# Patient Record
Sex: Male | Born: 1995 | Race: White | Hispanic: No | Smoking: Never smoker
Health system: Southern US, Community
[De-identification: ages and names within clinical notes are randomized; demographics above are authoritative.]

## PROBLEM LIST (undated history)

## (undated) DIAGNOSIS — F909 Attention-deficit hyperactivity disorder, unspecified type: Secondary | ICD-10-CM

---
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
## 2019-03-07 ENCOUNTER — Other Ambulatory Visit: Payer: Self-pay

## 2019-03-07 ENCOUNTER — Ambulatory Visit
Admission: EM | Admit: 2019-03-07 | Discharge: 2019-03-07 | Disposition: A | Discharge status: Home / Self Care | Payer: Self-pay | Attending: Physician Assistant | Admitting: Physician Assistant

## 2019-03-07 DIAGNOSIS — Z20828 Contact with and (suspected) exposure to other viral communicable diseases: Secondary | ICD-10-CM

## 2019-03-07 DIAGNOSIS — Z20822 Contact with and (suspected) exposure to covid-19: Secondary | ICD-10-CM

## 2019-03-07 NOTE — ED Triage Notes (Addendum)
Pt requesting COVID testing d/t positive exposure on 12/25. Denies any sx's

## 2019-03-07 NOTE — Discharge Instructions (Signed)
COVID testing ordered. As discussed, given your exposure, I would like you to quarantine for 14 days regardless of results. Monitor for any symptoms such as cough, congestion, shortness of breath, loss of taste/smell, fever, may need to extend quarantine time. Go to the emergency department for further evaluation if you develop significant shortness of breath, cannot speak in full sentences.    

## 2019-03-07 NOTE — ED Provider Notes (Signed)
[facility]    CSN: [id number] Arrival date & time: 03/07/19  0849      History   Chief Complaint Chief Complaint  Patient presents with  . covid exposure 12/25    HPI Ryan Mercer is a 23 y.o. male.   23 year old male comes in for COVID testing after positive exposure. Had exposure 5 days ago, but now living with someone who tested positive for COVID 2 days ago. Patient remains asymptomatic. Denies URI symptoms such as cough, congestion, sore throat. Denies fever, chills, body aches. Denies abdominal pain, nausea, vomiting, diarrhea. Denies shortness of breath, loss of taste/smell. No antipyretics in the last 8 hours.       Past Medical History:  Diagnosis Date  . ADHD (attention deficit hyperactivity disorder)     There are no problems to display for this patient.   History reviewed. No pertinent surgical history.     Home Medications    Prior to Admission medications   Not on File    Family History History reviewed. No pertinent family history.  Social History Social History   Tobacco Use  . Smoking status: Never Smoker  . Smokeless tobacco: Never Used  Substance Use Topics  . Alcohol use: Yes  . Drug use: Not Currently     Allergies   Patient has no known allergies.   Review of Systems Review of Systems  Reason unable to perform ROS: See HPI as above.     Physical Exam Triage Vital Signs ED Triage Vitals [03/07/19 0941]  Enc Vitals Group     BP 103/66     Pulse Rate 65     Resp 16     Temp 98.1 F (36.7 C)     Temp Source Oral     SpO2 97 %     Weight      Height      Head Circumference      Peak Flow      Pain Score 0     Pain Loc      Pain Edu?      Excl. in [place]?    No data found.  Updated Vital Signs BP 103/66 (BP Location: Left Arm)   Pulse 65   Temp 98.1 F (36.7 C) (Oral)   Resp 16   SpO2 97%   Physical Exam Constitutional:      General: He is not in acute distress.    Appearance:  Normal appearance. He is not ill-appearing, toxic-appearing or diaphoretic.  HENT:     Head: Normocephalic and atraumatic.     Mouth/Throat:     Mouth: Mucous membranes are moist.     Pharynx: Oropharynx is clear. Uvula midline.  Cardiovascular:     Rate and Rhythm: Normal rate and regular rhythm.     Heart sounds: Normal heart sounds. No murmur. No friction rub. No gallop.   Pulmonary:     Effort: Pulmonary effort is normal. No accessory muscle usage, prolonged expiration, respiratory distress or retractions.     Comments: Lungs clear to auscultation without adventitious lung sounds. Musculoskeletal:     Cervical back: Normal range of motion and neck supple.  Neurological:     General: No focal deficit present.     Mental Status: He is alert and oriented to person, place, and time.      UC Treatments / Results  Labs (all labs ordered are listed, but only abnormal results are displayed) Labs Reviewed  NOVEL  CORONAVIRUS, NAA    EKG   Radiology No results found.  Procedures Procedures (including critical care time)  Medications Ordered in UC Medications - No data to display  Initial Impression / Assessment and Plan / UC Course  I have reviewed the triage vital signs and the nursing notes.  Pertinent labs & imaging results that were available during my care of the patient were reviewed by me and considered in my medical decision making (see chart for details).    Discussed with patient, given positive exposure without symptoms, could still be within incubation period. Given patient living with positive COVID family member, he will need to quarantine for 14 days regardless of testing results. Patient expresses understanding and would like to proceed with testing.  COVID testing ordered.  Patient will continue to monitor symptoms, may need to extend quarantine if develop any symptoms.  Return precautions given.  Final Clinical Impressions(s) / UC Diagnoses   Final  diagnoses:  Close exposure to COVID-19 virus    ED Prescriptions    None     PDMP not reviewed this encounter.   Belinda Fisher, PA-C 03/07/19 1126

## 2019-03-08 LAB — NOVEL CORONAVIRUS, NAA: SARS-CoV-2, NAA: NOT DETECTED

## 2020-03-07 ENCOUNTER — Emergency Department (HOSPITAL_COMMUNITY)
Admission: EM | Admit: 2020-03-07 | Discharge: 2020-03-07 | Disposition: A | Discharge status: Home / Self Care | Payer: Medicaid Other | Attending: Emergency Medicine | Admitting: Emergency Medicine

## 2020-03-07 ENCOUNTER — Encounter (HOSPITAL_COMMUNITY): Payer: Self-pay | Admitting: Emergency Medicine

## 2020-03-07 DIAGNOSIS — Z20822 Contact with and (suspected) exposure to covid-19: Secondary | ICD-10-CM | POA: Insufficient documentation

## 2020-03-07 LAB — RESP PANEL BY RT-PCR (FLU A&B, COVID) ARPGX2
Influenza A by PCR: NEGATIVE
Influenza B by PCR: NEGATIVE
SARS Coronavirus 2 by RT PCR: NEGATIVE

## 2020-03-07 NOTE — ED Triage Notes (Signed)
Pt requesting covid swab due to recent exposure at work. No symptoms.

## 2020-03-07 NOTE — ED Provider Notes (Signed)
[facility] EMERGENCY DEPARTMENT Provider Note   CSN: [id number] Arrival date & time: 03/07/20  1249     History No chief complaint on file.   Ryan Mercer is a 25 y.o. male presenting for covid test.   Patient states multiple folks that he works with have tested positive and been out of work due to [employer].  As he is unvaccinated, his boss requested that he be tested/checked.  Patient states he is asymptomatic.  His last potential exposure was 2 days ago.  He does not think he has spent more than 15 minutes within 6 feet of any positive Covid person.  No fevers, chills, cough, chest pain, shortness of breath.  HPI     Past Medical History:  Diagnosis Date  . ADHD (attention deficit hyperactivity disorder)     There are no problems to display for this patient.   History reviewed. No pertinent surgical history.     No family history on file.  Social History   Tobacco Use  . Smoking status: Never Smoker  . Smokeless tobacco: Never Used  Substance Use Topics  . Alcohol use: Yes  . Drug use: Not Currently    Home Medications Prior to Admission medications   Not on File    Allergies    Patient has no known allergies.  Review of Systems   Review of Systems  All other systems reviewed and are negative.   Physical Exam Updated Vital Signs BP 126/68   Pulse 69   Temp 98 F (36.7 C)   Resp 14   Ht 5\' 8"  (1.727 m)   SpO2 100%   Physical Exam Vitals and nursing note reviewed.  Constitutional:      General: He is not in acute distress.    Appearance: He is well-developed and well-nourished.     Comments: No acute distress  HENT:     Head: Normocephalic and atraumatic.  Eyes:     Extraocular Movements: EOM normal.  Cardiovascular:     Pulses: Normal pulses.  Pulmonary:     Effort: Pulmonary effort is normal.     Breath sounds: Normal breath sounds.     Comments: Clear lung sounds in all fields.  Speaking in full  sentences. Abdominal:     General: There is no distension.  Musculoskeletal:        General: Normal range of motion.     Cervical back: Normal range of motion.  Skin:    General: Skin is warm.     Findings: No rash.  Neurological:     Mental Status: He is alert and oriented to person, place, and time.  Psychiatric:        Mood and Affect: Mood and affect normal.     ED Results / Procedures / Treatments   Labs (all labs ordered are listed, but only abnormal results are displayed) Labs Reviewed  RESP PANEL BY RT-PCR (FLU A&B, COVID) [id number]    EKG None  Radiology No results found.  Procedures Procedures (including critical care time)  Medications Ordered in ED Medications - No data to display  ED Course  I have reviewed the triage vital signs and the nursing notes.  Pertinent labs & imaging results that were available during my care of the patient were reviewed by me and considered in my medical decision making (see chart for details).    MDM Rules/Calculators/A&P  Patient presenting for Covid test.  On exam, patient peers nontoxic.  He is asymptomatic.  Covid test obtained from waiting room, it was negative. Discussed with pt. discussed current CDC recommendations regarding quarantine/isolation, including that a negative test does not indicate that a person will not develop sxs/covid. At this time, pt appears safe for d/c. Return precautions given. Pt states he understands and agrees to plan.   Ryan Mercer was evaluated in Emergency Department on 03/07/2020 for the symptoms described in the history of present illness. He was evaluated in the context of the global COVID-19 pandemic, which necessitated consideration that the patient might be at risk for infection with the SARS-CoV-2 virus that causes COVID-19. Institutional protocols and algorithms that pertain to the evaluation of patients at risk for COVID-19 are in a state of rapid change  based on information released by regulatory bodies including the CDC and federal and state organizations. These policies and algorithms were followed during the patient's care in the ED.   Final Clinical Impression(s) / ED Diagnoses Final diagnoses:  Close exposure to COVID-19 virus    Rx / DC Orders ED Discharge Orders    None       Alveria Apley, PA-C 03/07/20 1701    Derwood Kaplan, MD 03/09/20 2330

## 2020-03-07 NOTE — Discharge Instructions (Addendum)
Your Covid test was negative today. However, if you were within 6 feet of somebody who is Covid positive for more than 15 minutes, as you are unvaccinated, you still need to quarantine at home for a total of 5 days.  You may end quarantine if you are symptom-free.   Make sure you are wearing a mask at all times to prevent infection. Return to the emergency room if you develop severe difficulty breathing, persistent severe chest pain, or any new, worsening, or concerning symptoms.

## 2020-03-07 NOTE — ED Notes (Signed)
Patient Alert and oriented to baseline. Stable and ambulatory to baseline. Patient verbalized understanding of the discharge instructions.  Patient belongings were taken by the patient.   

## 2020-03-15 ENCOUNTER — Emergency Department (HOSPITAL_COMMUNITY)
Admission: EM | Admit: 2020-03-15 | Discharge: 2020-03-15 | Disposition: A | Discharge status: Home / Self Care | Payer: HRSA Program | Attending: Emergency Medicine | Admitting: Emergency Medicine

## 2020-03-15 ENCOUNTER — Encounter (HOSPITAL_COMMUNITY): Payer: Self-pay

## 2020-03-15 ENCOUNTER — Other Ambulatory Visit: Payer: Self-pay

## 2020-03-15 DIAGNOSIS — M25511 Pain in right shoulder: Secondary | ICD-10-CM | POA: Diagnosis not present

## 2020-03-15 DIAGNOSIS — R6883 Chills (without fever): Secondary | ICD-10-CM

## 2020-03-15 DIAGNOSIS — R519 Headache, unspecified: Secondary | ICD-10-CM

## 2020-03-15 DIAGNOSIS — R509 Fever, unspecified: Secondary | ICD-10-CM | POA: Diagnosis present

## 2020-03-15 DIAGNOSIS — M791 Myalgia, unspecified site: Secondary | ICD-10-CM

## 2020-03-15 DIAGNOSIS — U071 COVID-19: Secondary | ICD-10-CM | POA: Insufficient documentation

## 2020-03-15 DIAGNOSIS — Z20822 Contact with and (suspected) exposure to covid-19: Secondary | ICD-10-CM

## 2020-03-15 LAB — SARS CORONAVIRUS 2 (TAT 6-24 HRS): SARS Coronavirus 2: POSITIVE — AB

## 2020-03-15 NOTE — ED Provider Notes (Signed)
[facility] EMERGENCY DEPARTMENT Provider Note   CSN: [id number] Arrival date & time: 03/15/20  5110     History No chief complaint on file.   Ryan Mercer is a 25 y.o. male.  25 year old male comes in with symptoms of myalgias headache and chills exposed to COVID.  Tolerating p.o. normal work of breathing no congestion cough no GI symptoms.  Not vaccinated for COVID.  Also complains of right shoulder pain, chronic for over years, no new trauma no skin changes, no weakness no tingling no numbness.  Patient has triedmedications for the symptoms, does not reach out to any provider prior to this.  Symptoms are mild to moderate        Past Medical History:  Diagnosis Date  . ADHD (attention deficit hyperactivity disorder)     There are no problems to display for this patient.   History reviewed. No pertinent surgical history.     No family history on file.  Social History   Tobacco Use  . Smoking status: Never Smoker  . Smokeless tobacco: Never Used  Substance Use Topics  . Alcohol use: Yes  . Drug use: Not Currently    Home Medications Prior to Admission medications   Not on File    Allergies    Patient has no known allergies.  Review of Systems   Review of Systems  Constitutional: Positive for chills. Negative for fever.  HENT: Negative for congestion and rhinorrhea.   Respiratory: Negative for cough and shortness of breath.   Cardiovascular: Negative for chest pain and palpitations.  Gastrointestinal: Negative for diarrhea, nausea and vomiting.  Genitourinary: Negative for difficulty urinating and dysuria.  Musculoskeletal: Positive for arthralgias and myalgias. Negative for back pain.  Skin: Negative for color change and rash.  Neurological: Positive for headaches. Negative for light-headedness.    Physical Exam Updated Vital Signs BP 127/79   Pulse 80   Temp 98.4 F (36.9 C) (Oral)   Resp 16   SpO2 99%   Physical  Exam Vitals and nursing note reviewed.  Constitutional:      General: He is not in acute distress.    Appearance: Normal appearance.  HENT:     Head: Normocephalic and atraumatic.     Nose: No rhinorrhea.     Mouth/Throat:     Mouth: Mucous membranes are moist.  Eyes:     General:        Right eye: No discharge.        Left eye: No discharge.     Conjunctiva/sclera: Conjunctivae normal.  Cardiovascular:     Rate and Rhythm: Normal rate and regular rhythm.  Pulmonary:     Effort: Pulmonary effort is normal. No respiratory distress.     Breath sounds: No stridor. No wheezing.  Abdominal:     General: Abdomen is flat. There is no distension.     Palpations: Abdomen is soft.     Tenderness: There is no abdominal tenderness.  Musculoskeletal:        General: No deformity or signs of injury.     Comments: Full range of motion of the right shoulder neurovascular intact no focal bony tenderness  Skin:    General: Skin is warm and dry.     Capillary Refill: Capillary refill takes less than 2 seconds.  Neurological:     General: No focal deficit present.     Mental Status: He is alert. Mental status is at baseline.  Motor: No weakness.  Psychiatric:        Mood and Affect: Mood normal.        Behavior: Behavior normal.        Thought Content: Thought content normal.     ED Results / Procedures / Treatments   Labs (all labs ordered are listed, but only abnormal results are displayed) Labs Reviewed  SARS CORONAVIRUS 2 (TAT 6-24 HRS)    EKG None  Radiology No results found.  Procedures Procedures (including critical care time)  Medications Ordered in ED Medications - No data to display  ED Course  I have reviewed the triage vital signs and the nursing notes.  Pertinent labs & imaging results that were available during my care of the patient were reviewed by me and considered in my medical decision making (see chart for details).    MDM Rules/Calculators/A&P                           COVID exposure mild flulike symptoms.  Normal work of breathing normal vital signs clear lungs well-hydrated.  Chronic shoulder pain given outpatient orthopedic follow-up.  Given return precautions.  COVID test sent and pending at time of discharge with instructions given. Final Clinical Impression(s) / ED Diagnoses Final diagnoses:  Acute nonintractable headache, unspecified headache type  Myalgia  Chills  Close exposure to COVID-19 virus    Rx / DC Orders ED Discharge Orders    None       Sabino Donovan, MD 03/15/20 1206

## 2020-03-15 NOTE — ED Triage Notes (Signed)
Patient requesting covid test because family has covid. Patient denies symptoms reports some vague chills and was tested on 1/1 for same. Patient also complains of 1 year of right shoulder pain

## 2020-03-15 NOTE — Discharge Instructions (Addendum)

## 2021-05-21 ENCOUNTER — Ambulatory Visit (INDEPENDENT_AMBULATORY_CARE_PROVIDER_SITE_OTHER): Payer: Self-pay

## 2021-05-21 ENCOUNTER — Ambulatory Visit: Payer: Self-pay

## 2021-05-21 ENCOUNTER — Ambulatory Visit
Admission: EM | Admit: 2021-05-21 | Discharge: 2021-05-21 | Disposition: A | Discharge status: Home / Self Care | Payer: Self-pay | Attending: Physician Assistant | Admitting: Physician Assistant

## 2021-05-21 ENCOUNTER — Other Ambulatory Visit: Payer: Self-pay

## 2021-05-21 DIAGNOSIS — Y9367 Activity, basketball: Secondary | ICD-10-CM

## 2021-05-21 DIAGNOSIS — M79672 Pain in left foot: Secondary | ICD-10-CM

## 2021-05-21 MED ORDER — PREDNISONE 20 MG PO TABS: 40.0000 mg | ORAL_TABLET | Freq: Every day | ORAL | 0 refills | Status: AC

## 2021-05-21 NOTE — ED Triage Notes (Signed)
Patient presents to Urgent Care with complaints of left ankle injury from basket ball injury yesterday. He states constant numbness and tingling.  He states increased pain with ambulation. He states treating pain with ibuprofen and ice.  ? ?

## 2021-05-21 NOTE — ED Provider Notes (Signed)
?[facility] ? ? ? ?CSN: [id number] ?Arrival date & time: 05/21/21  1102 ? ? ?  ? ?History   ?Chief Complaint ?Chief Complaint  ?Patient presents with  ? Foot Injury  ? ? ?HPI ?Ryan Mercer is a 26 y.o. male.  ? ?Patient here today for evaluation of left ankle/foot pain that started after an injury in basketball yesterday.  He states he has throbbing pain with some tingling and numbness.  He reports that pain is worse with walking.  He has tried using ibuprofen and ice without significant relief. ? ?The history is provided by the patient.  ? ?Past Medical History:  ?Diagnosis Date  ? ADHD (attention deficit hyperactivity disorder)   ? ? ?There are no problems to display for this patient. ? ? ?History reviewed. No pertinent surgical history. ? ? ? ? ?Home Medications   ? ?Prior to Admission medications   ?Medication Sig Start Date End Date Taking? Authorizing Provider  ?predniSONE (DELTASONE) 20 MG tablet Take 2 tablets (40 mg total) by mouth daily with breakfast for 5 days. 05/21/21 05/26/21 Yes Tomi Bamberger, PA-C  ? ? ?Family History ?History reviewed. No pertinent family history. ? ?Social History ?Social History  ? ?Tobacco Use  ? Smoking status: Never  ? Smokeless tobacco: Never  ?Vaping Use  ? Vaping Use: Some days  ?Substance Use Topics  ? Alcohol use: Yes  ? Drug use: Not Currently  ? ? ? ?Allergies   ?Patient has no known allergies. ? ? ?Review of Systems ?Review of Systems  ?Constitutional:  Negative for chills and fever.  ?Eyes:  Negative for discharge and redness.  ?Musculoskeletal:  Positive for arthralgias.  ?Skin:  Negative for color change.  ?Neurological:  Positive for numbness.  ? ? ?Physical Exam ?Triage Vital Signs ?ED Triage Vitals  ?Enc Vitals Group  ?   BP   ?   Pulse   ?   Resp   ?   Temp   ?   Temp src   ?   SpO2   ?   Weight   ?   Height   ?   Head Circumference   ?   Peak Flow   ?   Pain Score   ?   Pain Loc   ?   Pain Edu?   ?   Excl. in [place]?   ? ?No data  found. ? ?Updated Vital Signs ?BP 127/81 (BP Location: Left Arm)   Pulse 77   Temp 98.2 ?F (36.8 ?C) (Oral)   Resp 16   SpO2 98%  ?  ? ?Physical Exam ?Vitals and nursing note reviewed.  ?Constitutional:   ?   General: He is not in acute distress. ?   Appearance: Normal appearance. He is not ill-appearing.  ?HENT:  ?   Head: Normocephalic and atraumatic.  ?Eyes:  ?   Conjunctiva/sclera: Conjunctivae normal.  ?Cardiovascular:  ?   Rate and Rhythm: Normal rate.  ?Pulmonary:  ?   Effort: Pulmonary effort is normal.  ?Musculoskeletal:  ?   Comments: Mild swelling diffusely to dorsal midfoot  ?Neurological:  ?   Mental Status: He is alert.  ?   Comments: Gross sensation intact to left toes  ?Psychiatric:     ?   Mood and Affect: Mood normal.     ?   Behavior: Behavior normal.     ?   Thought Content: Thought content normal.  ? ? ? ?UC Treatments /  Results  ?Labs ?(all labs ordered are listed, but only abnormal results are displayed) ?Labs Reviewed - No data to display ? ?EKG ? ? ?Radiology ?DG Foot Complete Left ? ?Result Date: 05/21/2021 ?CLINICAL DATA:  Left foot pain after playing basketball. EXAM: LEFT FOOT - COMPLETE 3+ VIEW COMPARISON:  None. FINDINGS: No fracture or bone lesion. Joints are normally spaced and aligned. Soft tissues are unremarkable. IMPRESSION: Negative. Electronically Signed   By: Amie Portland M.D.   On: 05/21/2021 12:41  ? ?DG Foot Complete Left ? ?Result Date: 05/21/2021 ?CLINICAL DATA:  Left ankle injury from basketball yesterday. EXAM: LEFT FOOT - COMPLETE 3+ VIEW COMPARISON:  None. FINDINGS: The ankle mortise is symmetric and intact. No significant soft tissue swelling. Joint spaces are preserved. No acute fracture or dislocation. IMPRESSION: Normal left ankle radiographs. Electronically Signed   By: Neita Garnet M.D.   On: 05/21/2021 12:04   ? ?Procedures ?Procedures (including critical care time) ? ?Medications Ordered in UC ?Medications - No data to display ? ?Initial Impression /  Assessment and Plan / UC Course  ?I have reviewed the triage vital signs and the nursing notes. ? ?Pertinent labs & imaging results that were available during my care of the patient were reviewed by me and considered in my medical decision making (see chart for details). ? ?  ?Xray without fracture. Will trial steroid burst but recommended follow up with ortho if no improvement.  ? ?Final Clinical Impressions(s) / UC Diagnoses  ? ?Final diagnoses:  ?Left foot pain  ? ?Discharge Instructions   ?None ?  ? ?ED Prescriptions   ? ? Medication Sig Dispense Auth. Provider  ? predniSONE (DELTASONE) 20 MG tablet Take 2 tablets (40 mg total) by mouth daily with breakfast for 5 days. 10 tablet Tomi Bamberger, PA-C  ? ?  ? ?PDMP not reviewed this encounter. ?  ?Tomi Bamberger, PA-C ?05/21/21 1640 ? ?

## 2022-05-04 ENCOUNTER — Encounter (HOSPITAL_BASED_OUTPATIENT_CLINIC_OR_DEPARTMENT_OTHER): Payer: Self-pay | Admitting: Emergency Medicine

## 2022-05-04 ENCOUNTER — Emergency Department (HOSPITAL_BASED_OUTPATIENT_CLINIC_OR_DEPARTMENT_OTHER)
Admission: EM | Admit: 2022-05-04 | Discharge: 2022-05-04 | Disposition: A | Discharge status: Home / Self Care | Payer: Self-pay | Attending: Emergency Medicine | Admitting: Emergency Medicine

## 2022-05-04 ENCOUNTER — Other Ambulatory Visit: Payer: Self-pay

## 2022-05-04 DIAGNOSIS — H6692 Otitis media, unspecified, left ear: Secondary | ICD-10-CM | POA: Insufficient documentation

## 2022-05-04 DIAGNOSIS — K029 Dental caries, unspecified: Secondary | ICD-10-CM | POA: Insufficient documentation

## 2022-05-04 MED ORDER — AMOXICILLIN-POT CLAVULANATE 875-125 MG PO TABS
1.0000 | ORAL_TABLET | Freq: Once | ORAL | Status: AC
  Administered 2022-05-04: 1 via ORAL
  Filled 2022-05-04: qty 1

## 2022-05-04 MED ORDER — AMOXICILLIN-POT CLAVULANATE 875-125 MG PO TABS: 1.0000 | ORAL_TABLET | Freq: Two times a day (BID) | ORAL | 0 refills | Status: DC

## 2022-05-04 NOTE — ED Provider Notes (Signed)
[place] EMERGENCY DEPARTMENT AT [facility] [place] [place] Provider Note   CSN: [id number] Arrival date & time: 05/04/22  1820     History  Chief Complaint  Patient presents with   Otalgia    Ryan Mercer is a 27 y.o. male here presenting with left ear pain.  Patient has left-sided ear pain and headache for several days.  Patient states that he had previous ear infection with similar symptoms.  Patient also has dental pain as well.  Patient denies any trouble swallowing.  Denies neck stiffness  The history is provided by the patient.       Home Medications Prior to Admission medications   Not on File      Allergies    Patient has no known allergies.    Review of Systems   Review of Systems  HENT:  Positive for ear pain.   All other systems reviewed and are negative.   Physical Exam Updated Vital Signs BP 126/76 (BP Location: Right Arm)   Pulse 75   Temp 98.5 F (36.9 C) (Oral)   Resp 18   Ht '5\' 8"'$  (1.727 m)   Wt 74.8 kg   SpO2 100%   BMI 25.09 kg/m  Physical Exam Vitals and nursing note reviewed.  Constitutional:      Appearance: Normal appearance.  HENT:     Head: Normocephalic.     Ears:     Comments: Left TM is slightly erythematous and bulging.  Right TM is normal    Nose: Nose normal.     Mouth/Throat:     Mouth: Mucous membranes are moist.     Comments: Patient has cavity in the left lower molar.  There is no obvious periapical abscess.  Floor mouth is soft Eyes:     Extraocular Movements: Extraocular movements intact.     Pupils: Pupils are equal, round, and reactive to light.  Neck:     Comments: No cervical lymphadenopathy Cardiovascular:     Rate and Rhythm: Normal rate and regular rhythm.     Pulses: Normal pulses.     Heart sounds: Normal heart sounds.  Pulmonary:     Effort: Pulmonary effort is normal.     Breath sounds: Normal breath sounds.  Abdominal:     General: Abdomen is flat.     Palpations: Abdomen is soft.   Musculoskeletal:        General: Normal range of motion.     Cervical back: Normal range of motion and neck supple.  Skin:    General: Skin is warm.     Capillary Refill: Capillary refill takes less than 2 seconds.  Neurological:     General: No focal deficit present.     Mental Status: He is alert and oriented to person, place, and time.  Psychiatric:        Mood and Affect: Mood normal.        Behavior: Behavior normal.     ED Results / Procedures / Treatments   Labs (all labs ordered are listed, but only abnormal results are displayed) Labs Reviewed - No data to display  EKG None  Radiology No results found.  Procedures Procedures    Medications Ordered in ED Medications  amoxicillin-clavulanate (AUGMENTIN) 875-125 MG per tablet 1 tablet (has no administration in time range)    ED Course/ Medical Decision Making/ A&P  Medical Decision Making Ryan Mercer is a 27 y.o. male here presenting with left ear pain.  Patient has mild otitis media on exam.  Patient also has dental caries on the left lower molar area but no obvious periapical abscess.  Patient has no signs of Ludwig angina.  Will discharge home with a course of Augmentin and have him follow-up with dentist   Problems Addressed: Dental caries: acute illness or injury Left otitis media, unspecified otitis media type: acute illness or injury  Risk Prescription drug management.    Final Clinical Impression(s) / ED Diagnoses Final diagnoses:  None    Rx / DC Orders ED Discharge Orders     None         Drenda Freeze, MD 05/04/22 2107

## 2022-05-04 NOTE — ED Triage Notes (Signed)
Pt c/o LT ear/facial pain x couple days

## 2022-05-04 NOTE — Discharge Instructions (Signed)
You have a mild ear infection and dental caries.  Please take Augmentin twice daily for a week  Please see your dentist for follow-up  Take Tylenol or Motrin for pain  Return to ER if you have worse ear pain, trouble swallowing, dental pain

## 2022-10-06 ENCOUNTER — Emergency Department (HOSPITAL_COMMUNITY)
Admission: EM | Admit: 2022-10-06 | Discharge: 2022-10-07 | Discharge status: Against Medical Advice | Payer: 59 | Attending: Emergency Medicine | Admitting: Emergency Medicine

## 2022-10-06 ENCOUNTER — Other Ambulatory Visit: Payer: Self-pay

## 2022-10-06 ENCOUNTER — Encounter (HOSPITAL_COMMUNITY): Payer: Self-pay | Admitting: Emergency Medicine

## 2022-10-06 DIAGNOSIS — Z5321 Procedure and treatment not carried out due to patient leaving prior to being seen by health care provider: Secondary | ICD-10-CM | POA: Diagnosis not present

## 2022-10-06 DIAGNOSIS — R55 Syncope and collapse: Secondary | ICD-10-CM | POA: Diagnosis not present

## 2022-10-06 DIAGNOSIS — F410 Panic disorder [episodic paroxysmal anxiety] without agoraphobia: Secondary | ICD-10-CM | POA: Diagnosis not present

## 2022-10-06 LAB — CBC
HCT: 41.8 % (ref 39.0–52.0)
Hemoglobin: 14.2 g/dL (ref 13.0–17.0)
MCH: 30.1 pg (ref 26.0–34.0)
MCHC: 34 g/dL (ref 30.0–36.0)
MCV: 88.6 fL (ref 80.0–100.0)
Platelets: 197 10*3/uL (ref 150–400)
RBC: 4.72 MIL/uL (ref 4.22–5.81)
RDW: 12.1 % (ref 11.5–15.5)
WBC: 9.9 10*3/uL (ref 4.0–10.5)
nRBC: 0 % (ref 0.0–0.2)

## 2022-10-06 LAB — BASIC METABOLIC PANEL
Anion gap: 9 (ref 5–15)
BUN: 12 mg/dL (ref 6–20)
CO2: 19 mmol/L — ABNORMAL LOW (ref 22–32)
Calcium: 7.7 mg/dL — ABNORMAL LOW (ref 8.9–10.3)
Chloride: 113 mmol/L — ABNORMAL HIGH (ref 98–111)
Creatinine, Ser: 0.82 mg/dL (ref 0.61–1.24)
GFR, Estimated: >60 mL/min (ref >60)
Glucose, Bld: 76 mg/dL (ref 70–99)
Potassium: 3 mmol/L — ABNORMAL LOW (ref 3.5–5.1)
Sodium: 141 mmol/L (ref 135–145)

## 2022-10-06 LAB — CBG MONITORING, ED: Glucose-Capillary: 85 mg/dL (ref 70–99)

## 2022-10-06 NOTE — ED Notes (Signed)
Pt eloped via registration.

## 2022-10-06 NOTE — ED Triage Notes (Signed)
Pt BIB EMS from work after having a panic attack and near syncopal episode. Per ems pt was pale upon arrival. Pt states that he feels fine now.  20 LAC  VS with EMS: 123/78 66 18 99RA Cbg 82

## 2022-10-19 ENCOUNTER — Other Ambulatory Visit: Payer: Self-pay

## 2022-10-19 ENCOUNTER — Emergency Department (HOSPITAL_COMMUNITY)
Admission: EM | Admit: 2022-10-19 | Discharge: 2022-10-19 | Disposition: A | Discharge status: Home / Self Care | Payer: 59 | Attending: Emergency Medicine | Admitting: Emergency Medicine

## 2022-10-19 ENCOUNTER — Encounter (HOSPITAL_COMMUNITY): Payer: Self-pay

## 2022-10-19 DIAGNOSIS — B360 Pityriasis versicolor: Secondary | ICD-10-CM | POA: Diagnosis not present

## 2022-10-19 DIAGNOSIS — R112 Nausea with vomiting, unspecified: Secondary | ICD-10-CM | POA: Insufficient documentation

## 2022-10-19 DIAGNOSIS — R197 Diarrhea, unspecified: Secondary | ICD-10-CM | POA: Diagnosis not present

## 2022-10-19 LAB — CBC
HCT: 47 % (ref 39.0–52.0)
Hemoglobin: 15.9 g/dL (ref 13.0–17.0)
MCH: 29.7 pg (ref 26.0–34.0)
MCHC: 33.8 g/dL (ref 30.0–36.0)
MCV: 87.9 fL (ref 80.0–100.0)
Platelets: 231 10*3/uL (ref 150–400)
RBC: 5.35 MIL/uL (ref 4.22–5.81)
RDW: 12.3 % (ref 11.5–15.5)
WBC: 8.9 10*3/uL (ref 4.0–10.5)
nRBC: 0 % (ref 0.0–0.2)

## 2022-10-19 LAB — LIPASE, BLOOD: Lipase: 36 U/L (ref 11–51)

## 2022-10-19 LAB — COMPREHENSIVE METABOLIC PANEL
ALT: 18 U/L (ref 0–44)
AST: 26 U/L (ref 15–41)
Albumin: 4.4 g/dL (ref 3.5–5.0)
Alkaline Phosphatase: 106 U/L (ref 38–126)
Anion gap: 11 (ref 5–15)
BUN: 6 mg/dL (ref 6–20)
CO2: 22 mmol/L (ref 22–32)
Calcium: 9.1 mg/dL (ref 8.9–10.3)
Chloride: 102 mmol/L (ref 98–111)
Creatinine, Ser: 0.8 mg/dL (ref 0.61–1.24)
GFR, Estimated: >60 mL/min (ref >60)
Glucose, Bld: 88 mg/dL (ref 70–99)
Potassium: 3.4 mmol/L — ABNORMAL LOW (ref 3.5–5.1)
Sodium: 135 mmol/L (ref 135–145)
Total Bilirubin: 0.4 mg/dL (ref 0.3–1.2)
Total Protein: 7.7 g/dL (ref 6.5–8.1)

## 2022-10-19 MED ORDER — KETOCONAZOLE 2 % EX SHAM: 1.0000 | MEDICATED_SHAMPOO | Freq: Every day | CUTANEOUS | 0 refills | Status: AC

## 2022-10-19 NOTE — ED Provider Notes (Signed)
[place] EMERGENCY DEPARTMENT AT [facility] Provider Note   CSN: [id number] Arrival date & time: 10/19/22  1252     History  Chief Complaint  Patient presents with   Abdominal Pain    Ryan Mercer is a 27 y.o. male.  Patient is a 27 year old male with ADHD but no other significant medical history who is presenting today with persistent abdominal pain.  Patient reports that on Monday he woke up with abdominal pain, diarrhea and vomiting.  He only had 3 episodes of emesis but had diarrhea all day long.  Yesterday he had 2 episodes of diarrhea with eating but denied any vomiting and today he has not had any vomiting or diarrhea but notices that he still having abdominal pain.  He reports it comes and goes and usually is around his umbilicus.  It can be sharp when he comes and it is present with certain movements and positions but is not always present with activity.  He reports the pain today is better than it was yesterday but still not gone.  No urinary symptoms.  Patient does not use any alcohol.  He did eat breakfast this morning but has not eaten lunch.  He has not had fever, known bad food exposure or other known sick contacts.  The history is provided by the patient.  Abdominal Pain      Home Medications Prior to Admission medications   Medication Sig Start Date End Date Taking? Authorizing Provider  ketoconazole (NIZORAL) 2 % shampoo Apply 1 Application topically daily for 14 days. apply 2% shampoo TOPICALLY to damp skin and a wide surrounding margin, lather, leave on skin for 5 minutes then rinse 10/19/22 11/02/22 Yes Stellar Gensel, Alphonzo Lemmings, MD  amoxicillin-clavulanate (AUGMENTIN) 875-125 MG tablet Take 1 tablet by mouth every 12 (twelve) hours. 05/04/22   Charlynne Pander, MD      Allergies    Patient has no known allergies.    Review of Systems   Review of Systems  Gastrointestinal:  Positive for abdominal pain.  Skin:        Patient noticed a rash on  his skin last week not sure how long it has been there present on his back in the back of his neck    Physical Exam Updated Vital Signs BP 117/83 (BP Location: Right Arm)   Pulse 77   Temp 98.3 F (36.8 C) (Oral)   Resp 18   Ht 5\' 8"  (1.727 m)   Wt 67.1 kg   SpO2 100%   BMI 22.50 kg/m  Physical Exam Vitals and nursing note reviewed.  Constitutional:      General: He is not in acute distress.    Appearance: He is well-developed.  HENT:     Head: Normocephalic and atraumatic.  Eyes:     Conjunctiva/sclera: Conjunctivae normal.     Pupils: Pupils are equal, round, and reactive to light.  Cardiovascular:     Rate and Rhythm: Normal rate and regular rhythm.     Heart sounds: No murmur heard. Pulmonary:     Effort: Pulmonary effort is normal. No respiratory distress.     Breath sounds: Normal breath sounds. No wheezing or rales.  Abdominal:     General: There is no distension.     Palpations: Abdomen is soft.     Tenderness: There is no abdominal tenderness. There is no right CVA tenderness, left CVA tenderness, guarding or rebound.     Comments: No reproducible abdominal pain with  deep palpation.  When patient sits up independently he reproduces the pain but reports its mild  Musculoskeletal:        General: No tenderness. Normal range of motion.     Cervical back: Normal range of motion and neck supple.  Skin:    General: Skin is warm and dry.     Findings: No erythema or rash.  Neurological:     Mental Status: He is alert and oriented to person, place, and time.  Psychiatric:        Behavior: Behavior normal.     ED Results / Procedures / Treatments   Labs (all labs ordered are listed, but only abnormal results are displayed) Labs Reviewed  COMPREHENSIVE METABOLIC PANEL - Abnormal; Notable for the following components:      Result Value   Potassium 3.4 (*)    All other components within normal limits  LIPASE, BLOOD  CBC  URINALYSIS, ROUTINE W REFLEX MICROSCOPIC     EKG None  Radiology No results found.  Procedures Procedures    Medications Ordered in ED Medications - No data to display  ED Course/ Medical Decision Making/ A&P                                 Medical Decision Making Amount and/or Complexity of Data Reviewed Labs: ordered. Decision-making details documented in ED Course.   Patient presenting today with abdominal pain in the setting of vomiting and diarrhea on Monday with symptoms improving but just not gone.  On exam patient's abdomen is benign.  Even with deep palpation and able to reproduce pain.  Vital signs are normal.  He denies any urinary symptoms and low suspicion for kidney stone, UTI, STI, testicular pathology.  No exam findings concerning for hernia.  No exam findings concerning for appendicitis today or perforation.  He has no peritoneal signs.  No risk factors for pancreatitis.  Suspect viral cause versus bad food exposure.  Patient is drinking water on exam without any difficulty.  Will ensure no electrolyte abnormalities or AKI.  I independently interpreted patient's labs and lipase within normal limits, CBC within normal limits, CMP with potassium today of 3.4 but everything else is normal.  At this time no indication for CT scan.  Will have patient continue to rehydrate, brat diet and a little more time.  Did give return precautions if the pain worsens to return for imaging.  Also patient was complaining of a rash on his back which is tinea versicolor.        Final Clinical Impression(s) / ED Diagnoses Final diagnoses:  Nausea vomiting and diarrhea  Tinea versicolor    Rx / DC Orders ED Discharge Orders          Ordered    ketoconazole (NIZORAL) 2 % shampoo  Daily        10/19/22 1542              Gwyneth Sprout, MD 10/19/22 1542

## 2022-10-19 NOTE — ED Triage Notes (Addendum)
Pt complaining of a generalized sharp abd pain since last week. States pain increases with movement/lifting. Endorses N/V/D.

## 2022-10-19 NOTE — Discharge Instructions (Signed)
Blood work looks okay today.  You most likely just had a bug and it will continue to get better but make sure you are staying hydrated and resting.  Stick with a bland diet such as chicken noodle soup, applesauce, toast and rice.  If you start having worsening pain in your abdomen, you start vomiting again or running a fever you should return to the emergency room.

## 2023-06-10 ENCOUNTER — Telehealth: Admitting: Nurse Practitioner

## 2023-06-10 DIAGNOSIS — K089 Disorder of teeth and supporting structures, unspecified: Secondary | ICD-10-CM

## 2023-06-10 DIAGNOSIS — K0889 Other specified disorders of teeth and supporting structures: Secondary | ICD-10-CM

## 2023-06-10 DIAGNOSIS — R1033 Periumbilical pain: Secondary | ICD-10-CM

## 2023-06-10 MED ORDER — DICYCLOMINE HCL 10 MG PO CAPS: 10.0000 mg | ORAL_CAPSULE | Freq: Three times a day (TID) | ORAL | 0 refills | Status: AC

## 2023-06-10 MED ORDER — PENICILLIN V POTASSIUM 500 MG PO TABS: 500.0000 mg | ORAL_TABLET | Freq: Three times a day (TID) | ORAL | 0 refills | Status: AC

## 2023-06-10 MED ORDER — IBUPROFEN 600 MG PO TABS: 600.0000 mg | ORAL_TABLET | Freq: Three times a day (TID) | ORAL | 0 refills | Status: DC | PRN

## 2023-06-10 NOTE — Progress Notes (Signed)
 Virtual Visit Consent   Ryan Mercer, you are scheduled for a virtual visit with a [facility] provider today. Just as with appointments in the office, your consent must be obtained to participate. Your consent will be active for this visit and any virtual visit you may have with one of our providers in the next 365 days. If you have a MyChart account, a copy of this consent can be sent to you electronically.  As this is a virtual visit, video technology does not allow for your provider to perform a traditional examination. This may limit your provider's ability to fully assess your condition. If your provider identifies any concerns that need to be evaluated in person or the need to arrange testing (such as labs, EKG, etc.), we will make arrangements to do so. Although advances in technology are sophisticated, we cannot ensure that it will always work on either your end or our end. If the connection with a video visit is poor, the visit may have to be switched to a telephone visit. With either a video or telephone visit, we are not always able to ensure that we have a secure connection.  By engaging in this virtual visit, you consent to the provision of healthcare and authorize for your insurance to be billed (if applicable) for the services provided during this visit. Depending on your insurance coverage, you may receive a charge related to this service.  I need to obtain your verbal consent now. Are you willing to proceed with your visit today? Vedder Brittian has provided verbal consent on 06/10/2023 for a virtual visit (video or telephone). Claiborne Rigg, NP  Date: 06/10/2023 7:20 PM   Virtual Visit via Video Note   I, Claiborne Rigg, connected with  Ryan Mercer  ([id number], June 07, 1995) on 06/10/23 at  7:15 PM EDT by a video-enabled telemedicine application and verified that I am speaking with the correct person using two identifiers.  Location: Patient: Virtual Visit  Location Patient: Home Provider: Virtual Visit Location Provider: Home Office   I discussed the limitations of evaluation and management by telemedicine and the availability of in person appointments. The patient expressed understanding and agreed to proceed.    History of Present Illness: Ryan Mercer is a 28 y.o. who identifies as a male who was assigned male at birth, and is being seen today for fractured tooth and abdominal pain.  Ryan Mercer is requesting antibiotics for a fractured tooth which is causing gum and facial swelling.    He also endorses periumbilical abdominal pain which has been ongoing for several months.  Relieving factors are drinking ginger ale or kombucha.pain is reported as sharp and occurs upon awakening and at night.  He endorses normal bowel movements occurring every day.  Denies any diarrhea, melena or hematochezia.  He denies any GU symptoms   Problems: There are no active problems to display for this patient.   Allergies: No Known Allergies Medications:  Current Outpatient Medications:    dicyclomine (BENTYL) 10 MG capsule, Take 1 capsule (10 mg total) by mouth 4 (four) times daily -  before meals and at bedtime., Disp: 45 capsule, Rfl: 0   ibuprofen (ADVIL) 600 MG tablet, Take 1 tablet (600 mg total) by mouth every 8 (eight) hours as needed. Take with food, Disp: 60 tablet, Rfl: 0   penicillin v potassium (VEETID) 500 MG tablet, Take 1 tablet (500 mg total) by mouth 3 (three) times daily for  7 days., Disp: 21 tablet, Rfl: 0  Observations/Objective: Patient is well-developed, well-nourished in no acute distress.  Resting comfortably at home.  Head is normocephalic, atraumatic.  No labored breathing.  Speech is clear and coherent with logical content.  Patient is alert and oriented at baseline.    Assessment and Plan: 1. Pain, dental (Primary) - ibuprofen (ADVIL) 600 MG tablet; Take 1 tablet (600 mg total) by mouth every 8 (eight) hours as  needed. Take with food  Dispense: 60 tablet; Refill: 0 - penicillin v potassium (VEETID) 500 MG tablet; Take 1 tablet (500 mg total) by mouth 3 (three) times daily for 7 days.  Dispense: 21 tablet; Refill: 0  2. Poor dentition - ibuprofen (ADVIL) 600 MG tablet; Take 1 tablet (600 mg total) by mouth every 8 (eight) hours as needed. Take with food  Dispense: 60 tablet; Refill: 0 - penicillin v potassium (VEETID) 500 MG tablet; Take 1 tablet (500 mg total) by mouth 3 (three) times daily for 7 days.  Dispense: 21 tablet; Refill: 0  3. Periumbilical abdominal pain - dicyclomine (BENTYL) 10 MG capsule; Take 1 capsule (10 mg total) by mouth 4 (four) times daily -  before meals and at bedtime.  Dispense: 45 capsule; Refill: 0  Establish care with PCP for possible GI referral  Follow Up Instructions: I discussed the assessment and treatment plan with the patient. The patient was provided an opportunity to ask questions and all were answered. The patient agreed with the plan and demonstrated an understanding of the instructions.  A copy of instructions were sent to the patient via MyChart unless otherwise noted below.    The patient was advised to call back or seek an in-person evaluation if the symptoms worsen or if the condition fails to improve as anticipated.    Claiborne Rigg, NP

## 2023-06-10 NOTE — Patient Instructions (Signed)
  Ryan Mercer, thank you for joining Claiborne Rigg, NP for today's virtual visit.  While this provider is not your primary care provider (PCP), if your PCP is located in our provider database this encounter information will be shared with them immediately following your visit.   A [facility] MyChart account gives you access to today's visit and all your visits, tests, and labs performed at [facility] " click here if you don't have a [facility] MyChart account or go to [email or web address]  Consent: (Patient) Ryan Mercer provided verbal consent for this virtual visit at the beginning of the encounter.  Current Medications:  Current Outpatient Medications:    dicyclomine (BENTYL) 10 MG capsule, Take 1 capsule (10 mg total) by mouth 4 (four) times daily -  before meals and at bedtime., Disp: 45 capsule, Rfl: 0   ibuprofen (ADVIL) 600 MG tablet, Take 1 tablet (600 mg total) by mouth every 8 (eight) hours as needed. Take with food, Disp: 60 tablet, Rfl: 0   penicillin v potassium (VEETID) 500 MG tablet, Take 1 tablet (500 mg total) by mouth 3 (three) times daily for 7 days., Disp: 21 tablet, Rfl: 0   Medications ordered in this encounter:  Meds ordered this encounter  Medications   ibuprofen (ADVIL) 600 MG tablet    Sig: Take 1 tablet (600 mg total) by mouth every 8 (eight) hours as needed. Take with food    Dispense:  60 tablet    Refill:  0    Supervising Provider:   Merrilee Jansky [[id number]]   dicyclomine (BENTYL) 10 MG capsule    Sig: Take 1 capsule (10 mg total) by mouth 4 (four) times daily -  before meals and at bedtime.    Dispense:  45 capsule    Refill:  0    Supervising Provider:   Merrilee Jansky [[id number]]   penicillin v potassium (VEETID) 500 MG tablet    Sig: Take 1 tablet (500 mg total) by mouth 3 (three) times daily for 7 days.    Dispense:  21 tablet    Refill:  0    Supervising Provider:   Merrilee Jansky [[id number]]     *If  you need refills on other medications prior to your next appointment, please contact your pharmacy*  Follow-Up: Call back or seek an in-person evaluation if the symptoms worsen or if the condition fails to improve as anticipated.  [facility] [phone]  Other Instructions Needs to establish with PCP for possible referral to GI   If you have been instructed to have an in-person evaluation today at a local Urgent Care facility, please use the link below. It will take you to a list of all of our available [facility], including address, phone number and hours of operation. Please do not delay care.  [facility]  If you or a family member do not have a primary care provider, use the link below to schedule a visit and establish care. When you choose a [facility] primary care physician or advanced practice provider, you gain a long-term partner in health. Find a Primary Care Provider  Learn more about [facility]'s in-office and virtual care options: [facility] - Get Care Now

## 2023-07-15 ENCOUNTER — Telehealth: Admitting: Emergency Medicine

## 2023-07-15 DIAGNOSIS — K0889 Other specified disorders of teeth and supporting structures: Secondary | ICD-10-CM

## 2023-07-15 MED ORDER — IBUPROFEN 800 MG PO TABS: 800.0000 mg | ORAL_TABLET | Freq: Three times a day (TID) | ORAL | 0 refills | Status: AC | PRN

## 2023-07-15 NOTE — Progress Notes (Signed)
 Virtual Visit Consent   Ryan Mercer, you are scheduled for a virtual visit with a [facility] provider today. Just as with appointments in the office, your consent must be obtained to participate. Your consent will be active for this visit and any virtual visit you may have with one of our providers in the next 365 days. If you have a MyChart account, a copy of this consent can be sent to you electronically.  As this is a virtual visit, video technology does not allow for your provider to perform a traditional examination. This may limit your provider's ability to fully assess your condition. If your provider identifies any concerns that need to be evaluated in person or the need to arrange testing (such as labs, EKG, etc.), we will make arrangements to do so. Although advances in technology are sophisticated, we cannot ensure that it will always work on either your end or our end. If the connection with a video visit is poor, the visit may have to be switched to a telephone visit. With either a video or telephone visit, we are not always able to ensure that we have a secure connection.  By engaging in this virtual visit, you consent to the provision of healthcare and authorize for your insurance to be billed (if applicable) for the services provided during this visit. Depending on your insurance coverage, you may receive a charge related to this service.  I need to obtain your verbal consent now. Are you willing to proceed with your visit today? Ryan Mercer has provided verbal consent on 07/15/2023 for a virtual visit (video or telephone). Blinda Burger, NP  Date: 07/15/2023 6:45 PM   Virtual Visit via Video Note   I, Blinda Burger, connected with  Ryan Mercer  ([id number], 1995/12/23) on 07/15/23 at  6:30 PM EDT by a video-enabled telemedicine application and verified that I am speaking with the correct person using two identifiers.  Location: Patient: Virtual Visit  Location Patient: Home Provider: Virtual Visit Location Provider: Home Office   I discussed the limitations of evaluation and management by telemedicine and the availability of in person appointments. The patient expressed understanding and agreed to proceed.    History of Present Illness: Ryan Mercer is a 28 y.o. who identifies as a male who was assigned male at birth, and is being seen today for dental pain. He had L lower tooth pulled Thursday 07/13/2023. Can't open mouth well. Told to rinse with salt water and he is doing so.  Told to take ibuprofen .  And he is taking advil  and ibuprofen  [phone] q6 hours for little relief of symptoms.  He is requesting something different for pain and he needs a note for work because the dentist office did not give him a note  He denies any swelling or lumps in the gum area that could indicate a pocket of pus.  He does not think he has an abscess.  He can feel the clot still in place  HPI: HPI  Problems: There are no active problems to display for this patient.   Allergies: No Known Allergies Medications:  Current Outpatient Medications:    ibuprofen  (ADVIL ) 800 MG tablet, Take 1 tablet (800 mg total) by mouth every 8 (eight) hours as needed., Disp: 30 tablet, Rfl: 0   dicyclomine  (BENTYL ) 10 MG capsule, Take 1 capsule (10 mg total) by mouth 4 (four) times daily -  before meals and at bedtime., Disp:  45 capsule, Rfl: 0  Observations/Objective: Patient is well-developed, well-nourished in no acute distress.  Resting comfortably  at home.  Head is normocephalic, atraumatic.  No labored breathing.  Speech is clear and coherent with logical content.  Patient is alert and oriented at baseline.  The left lower jaw area is slightly swollen compared to the right on video  Assessment and Plan: 1. Pain, dental (Primary)  If pain becomes severe, he needs to go to the ER.  Otherwise he can follow-up with the dentist on Monday (today is Saturday).  I  reviewed appropriate, safe doses of ibuprofen  with him and explained that Advil  and ibuprofen  are the same medicine.  I explained that I am not able to prescribe any controlled substances virtually via video visit due to legal restrictions on what can be prescribed virtually  Follow Up Instructions: I discussed the assessment and treatment plan with the patient. The patient was provided an opportunity to ask questions and all were answered. The patient agreed with the plan and demonstrated an understanding of the instructions.  A copy of instructions were sent to the patient via MyChart unless otherwise noted below.   The patient was advised to call back or seek an in-person evaluation if the symptoms worsen or if the condition fails to improve as anticipated.    Blinda Burger, NP

## 2023-07-15 NOTE — Patient Instructions (Signed)
  Ryan Mercer, thank you for joining Blinda Burger, NP for today's virtual visit.  While this provider is not your primary care provider (PCP), if your PCP is located in our provider database this encounter information will be shared with them immediately following your visit.   A [facility] MyChart account gives you access to today's visit and all your visits, tests, and labs performed at [facility] " click here if you don't have a [facility] MyChart account or go to [email or web address]  Consent: (Patient) Ryan Mercer provided verbal consent for this virtual visit at the beginning of the encounter.  Current Medications:  Current Outpatient Medications:    ibuprofen  (ADVIL ) 800 MG tablet, Take 1 tablet (800 mg total) by mouth every 8 (eight) hours as needed., Disp: 30 tablet, Rfl: 0   dicyclomine  (BENTYL ) 10 MG capsule, Take 1 capsule (10 mg total) by mouth 4 (four) times daily -  before meals and at bedtime., Disp: 45 capsule, Rfl: 0   Medications ordered in this encounter:  Meds ordered this encounter  Medications   ibuprofen  (ADVIL ) 800 MG tablet    Sig: Take 1 tablet (800 mg total) by mouth every 8 (eight) hours as needed.    Dispense:  30 tablet    Refill:  0     *If you need refills on other medications prior to your next appointment, please contact your pharmacy*  Follow-Up: Call back or seek an in-person evaluation if the symptoms worsen or if the condition fails to improve as anticipated.  [facility] [phone]  Other Instructions Ibuprofen  and Advil  are the same medicine.  The maximum safe dose of ibuprofen  is 800 mg every 8 hours.  Please do not take Advil  and ibuprofen  together.  And please do not take more than 800 mg of ibuprofen  every 8 hours.  I have prescribed this dose for you and sent it to the pharmacy.  If your pain becomes severe please be seen in the ER.  Otherwise follow-up with a dentist on Monday about  how you are feeling.  Continue to interbowel fluids salt water.   If you have been instructed to have an in-person evaluation today at a local Urgent Care facility, please use the link below. It will take you to a list of all of our available [facility], including address, phone number and hours of operation. Please do not delay care.  [facility]  If you or a family member do not have a primary care provider, use the link below to schedule a visit and establish care. When you choose a [facility] primary care physician or advanced practice provider, you gain a long-term partner in health. Find a Primary Care Provider  Learn more about 's in-office and virtual care options:  - Get Care Now

## 2023-08-04 IMAGING — DX DG FOOT COMPLETE 3+V*L*
3 series · 3 of 3 positions shown · non-contrast
Comparison: None.

CLINICAL DATA: Left ankle injury from basketball yesterday.

EXAM:
LEFT FOOT - COMPLETE 3+ VIEW

[ankle ap]
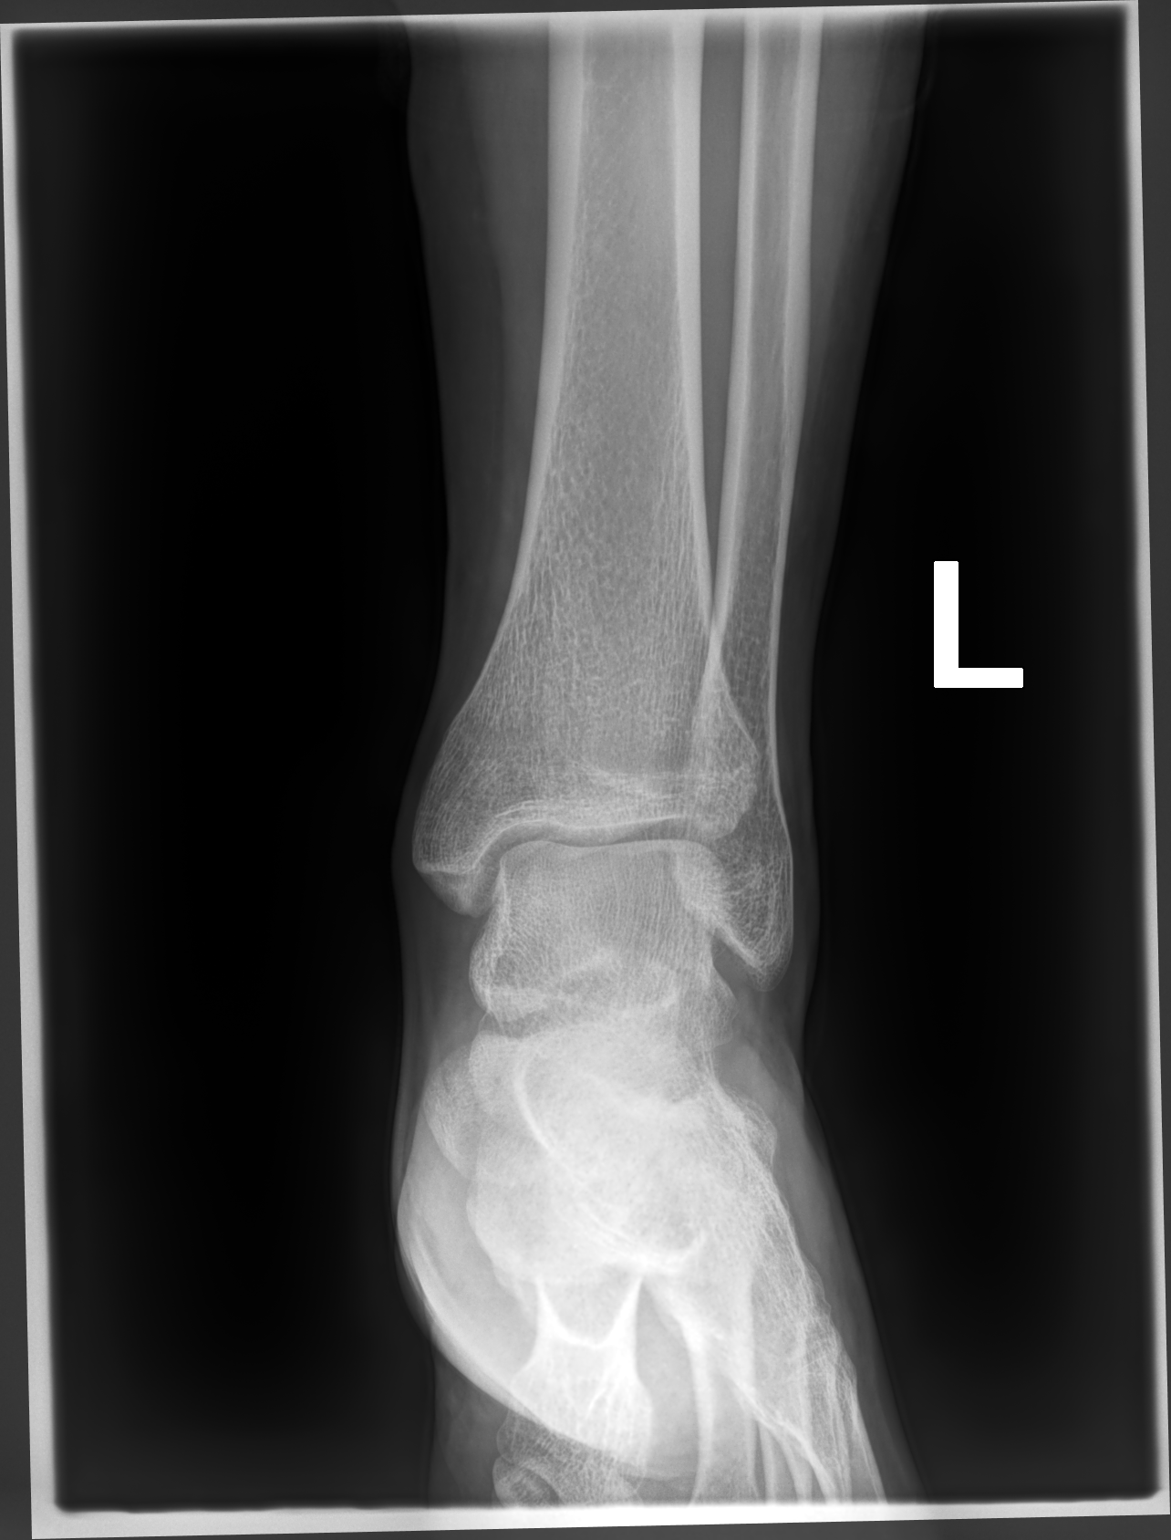

[ankle medial oblique]
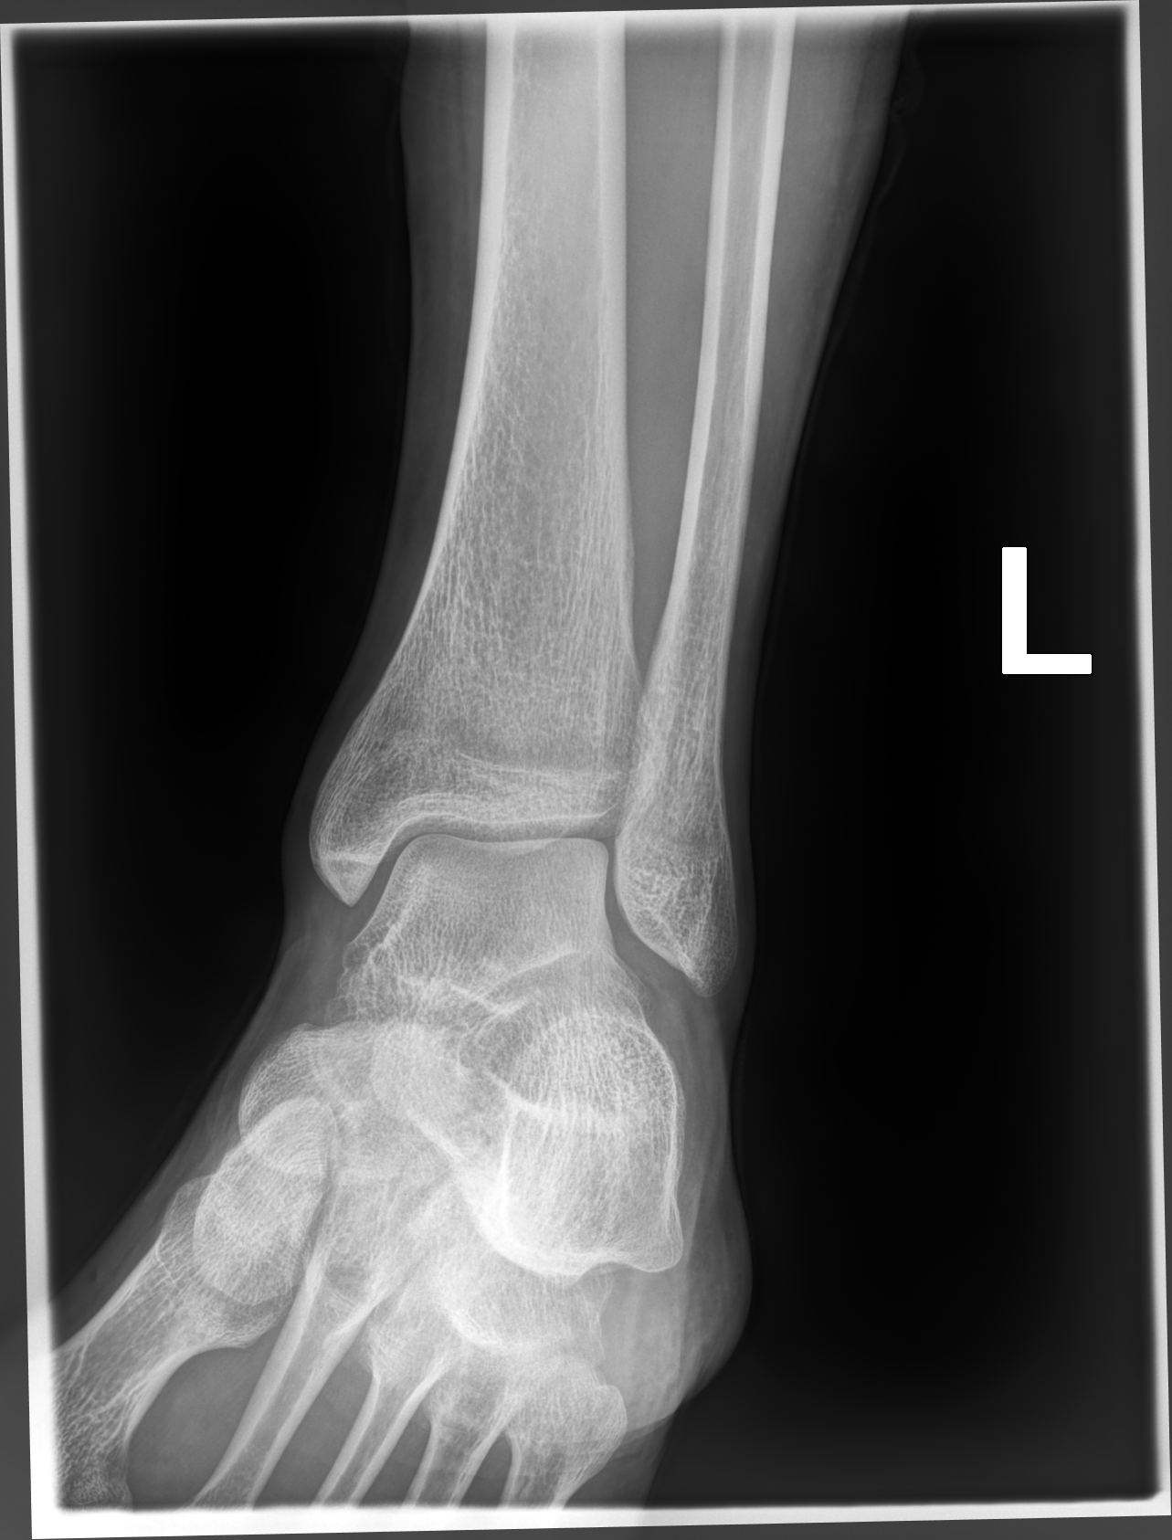

[ankle lat]
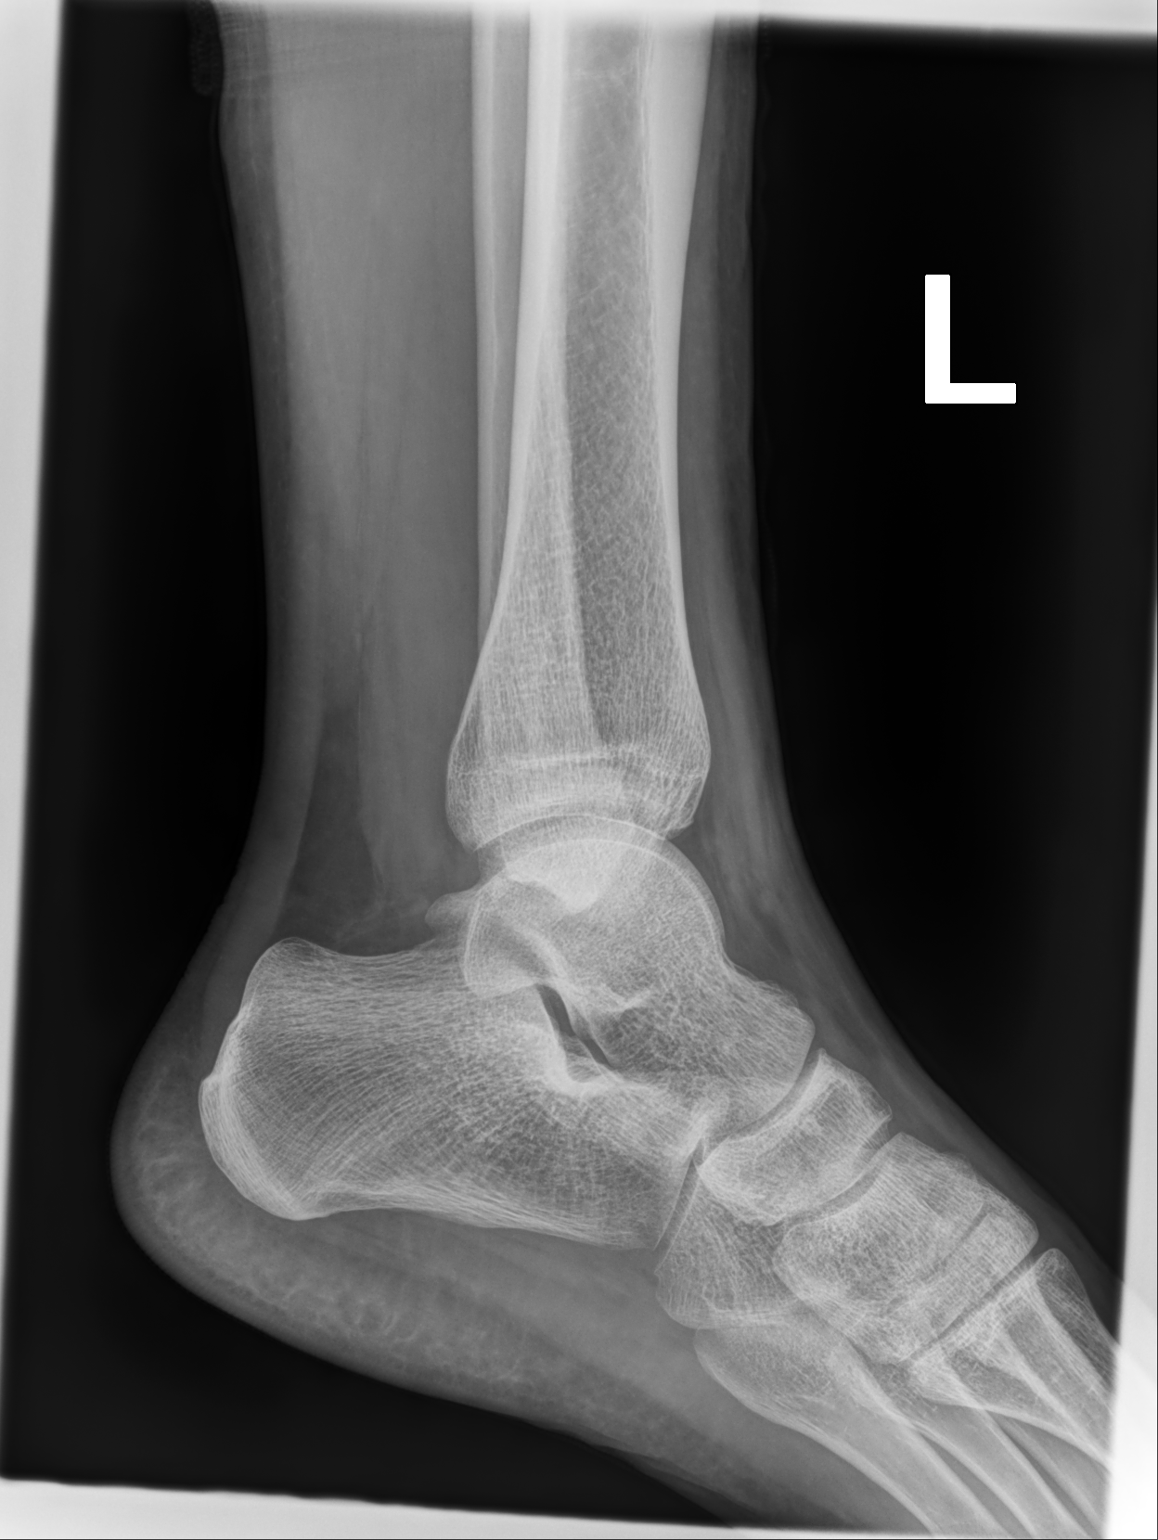

[3 of 3 positions shown; findings below may reference images not displayed]

FINDINGS: The ankle mortise is symmetric and intact. No significant soft
tissue swelling. Joint spaces are preserved. No acute fracture or
dislocation.
IMPRESSION: Normal left ankle radiographs.
